# Patient Record
Sex: Female | Born: 1955 | Race: White | Hispanic: No | Marital: Married | Smoking: Never smoker
Health system: Southern US, Community
[De-identification: ages and names within clinical notes are randomized; demographics above are authoritative.]

## PROBLEM LIST (undated history)

## (undated) DIAGNOSIS — M778 Other enthesopathies, not elsewhere classified: Secondary | ICD-10-CM

## (undated) HISTORY — PX: ELBOW SURGERY: SHX618

## (undated) HISTORY — DX: Other enthesopathies, not elsewhere classified: M77.8

---

## 2005-02-22 ENCOUNTER — Encounter: Admission: RE | Admit: 2005-02-22 | Discharge: 2005-02-22 | Payer: Self-pay | Admitting: Gynecology

## 2005-04-05 ENCOUNTER — Encounter: Admission: RE | Admit: 2005-04-05 | Discharge: 2005-04-05 | Payer: Self-pay | Admitting: Gynecology

## 2006-05-21 ENCOUNTER — Encounter: Admission: RE | Admit: 2006-05-21 | Discharge: 2006-05-21 | Payer: Self-pay | Admitting: Obstetrics & Gynecology

## 2006-05-23 ENCOUNTER — Encounter: Payer: Self-pay | Admitting: Family Medicine

## 2006-05-23 ENCOUNTER — Ambulatory Visit: Payer: Self-pay | Admitting: Obstetrics & Gynecology

## 2006-08-13 ENCOUNTER — Encounter: Admission: RE | Admit: 2006-08-13 | Discharge: 2006-08-13 | Payer: Self-pay | Admitting: Family Medicine

## 2007-06-23 ENCOUNTER — Ambulatory Visit (HOSPITAL_COMMUNITY): Admission: RE | Admit: 2007-06-23 | Discharge: 2007-06-23 | Payer: Self-pay | Admitting: Family Medicine

## 2007-06-26 ENCOUNTER — Encounter: Admission: RE | Admit: 2007-06-26 | Discharge: 2007-06-26 | Payer: Self-pay | Admitting: Family Medicine

## 2008-07-06 ENCOUNTER — Encounter: Payer: Self-pay | Admitting: Obstetrics & Gynecology

## 2008-07-06 ENCOUNTER — Ambulatory Visit: Payer: Self-pay | Admitting: Obstetrics & Gynecology

## 2008-07-08 ENCOUNTER — Encounter: Admission: RE | Admit: 2008-07-08 | Discharge: 2008-07-08 | Payer: Self-pay | Admitting: Family Medicine

## 2010-03-08 ENCOUNTER — Ambulatory Visit: Payer: Self-pay | Admitting: Family Medicine

## 2010-03-08 LAB — CONVERTED CEMR LAB
ALT: 19 units/L (ref 0–35)
AST: 22 units/L (ref 0–37)
Albumin: 4.9 g/dL (ref 3.5–5.2)
Alkaline Phosphatase: 66 units/L (ref 39–117)
Calcium: 9.5 mg/dL (ref 8.4–10.5)
Chloride: 103 meq/L (ref 96–112)
Cholesterol: 186 mg/dL (ref 0–200)
Hemoglobin: 13.9 g/dL (ref 12.0–15.0)
LDL Cholesterol: 82 mg/dL (ref 0–99)
MCHC: 32.9 g/dL (ref 30.0–36.0)
MCV: 100 fL (ref 78.0–100.0)
Potassium: 3.7 meq/L (ref 3.5–5.3)
RBC: 4.22 M/uL (ref 3.87–5.11)
Total Bilirubin: 0.8 mg/dL (ref 0.3–1.2)
Total Protein: 6.5 g/dL (ref 6.0–8.3)
Triglycerides: 52 mg/dL (ref <150)
WBC: 3.9 10*3/uL — ABNORMAL LOW (ref 4.0–10.5)

## 2010-12-17 ENCOUNTER — Encounter: Payer: Self-pay | Admitting: Family Medicine

## 2011-04-10 NOTE — Assessment & Plan Note (Signed)
[name], Tina Christensen NO.:  [id number]   MEDICAL RECORD NO.:  [id number]          PATIENT TYPE:  POB   LOCATION:  [facility] at [facility]         FACILITY:  [facility]   PHYSICIAN:  Tinnie Gens, MD        DATE OF BIRTH:  [birth year]   DATE OF SERVICE:  03/08/2010                                  CLINIC NOTE   CHIEF COMPLAINT:  Yearly exam.   HISTORY OF PRESENT ILLNESS:  The patient is a 55 year old, gravida 1,  para 1, who comes in today for yearly exam.  She is without complaints.  She has been menopausal since 2003.  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  The patient has just chest pain last year  placed nursing notes head to [place], that seems to have resolved  itself.  She continues on Zoloft daily for depression and notes that  when she stops this medicine she becomes dizzy.  Otherwise, is without  significant complaint today.   PAST MEDICAL HISTORY:  Significant for depression.   PAST SURGICAL HISTORY:  Elbow surgery.   MEDICATIONS:  Zoloft 50 mg p.o. daily.   ALLERGIES:  None known.   OBSTETRICAL HISTORY:  G1, P1, vaginal delivery.   GYNECOLOGIC HISTORY:  No history of abnormal Pap for STD.  She has been  married to the same man for many years.   FAMILY HISTORY:  Significant for Parkinson's disease.  No breast, GYN,  or colon cancer.  She does have history of hypertension in her elderly  parents.   SOCIAL HISTORY:  She is a social alcohol user about half a glass of wine  per day.  No tobacco, alcohol, or drugs.  She is working as a  Catering manager.   REVIEW OF SYSTEMS:  She denies headache, vision changes, shortness of  breath.  The patient reports some chest pain thought to be related,  returns almost just sitting there.  She had a 2-hour tennis match that  was fairly vigorous without any chest pain last night.  She states that  the pressure sensation does not  radiate to back or jaw or arm.  She  notes no diaphoresis or nausea associated with this chest pain.  She  would like to get her cholesterol checked.  She denies significant  heartburn, breast masses, breast tenderness, nipple discharge,  heartburn, diarrhea, constipation, abdominal pain, blood in her stool,  blood in her urine, trouble passing urine, feet swelling in her feet or  ankles.   PHYSICAL EXAMINATION:  VITAL SIGNS:  Today, her height is 5 feet 9  inches.  She is 120 pounds, blood pressure is 118/69.  GENERAL:  She is a well-developed, well-nourished female, in no acute  distress.  HEENT:  Normocephalic, atraumatic.  Sclerae are anicteric.  NECK:  Supple.  Normal thyroid.  LUNGS:  Clear bilaterally.  CV:  Regular rate and rhythm.  No rubs, gallops, or murmurs.  ABDOMEN:  Soft, nontender, nondistended.  BREASTS:  Symmetric with everted nipples.  No masses.  No  supraclavicular or axillary adenopathy.  GU:  Normal external female genitalia.  BUS is normal.  Vagina is pale  with loss of rugae.  Uterus is small, anteverted.  No adnexal mass or  tenderness with good exam possible.  EXTREMITIES:  No cyanosis, clubbing, or edema.   IMPRESSION:  1. Annual exam.  2. Age greater than 50.  3. Some intermittent chest pain, unclear etiology.   PLAN:  1. Pap smear today.  2. Schedule mammogram.  3. GI referral again for colonoscopy.  She is reminded that this is      necessary after age 11, although she has been reminded this for      several years and has not yet gone to get it done.  4. We will advise her to see the primary care physician if her chest      pain continues for full workup.  5. We will check cholesterol, TSH, CMP, and CBC today.  6. I have refilled her Zoloft for a year.           ______________________________  Tinnie Gens, MD     TP/MEDQ  D:  03/08/2010  T:  03/09/2010  Job:  [id number]

## 2011-04-10 NOTE — Assessment & Plan Note (Signed)
NAMEEILY, Tina NO.:  [id number]   MEDICAL RECORD NO.:  [id number]          PATIENT TYPE:  POB   LOCATION:  [facility] at [facility]         FACILITY:  [facility]   PHYSICIAN:  Allie Bossier, MD        DATE OF BIRTH:  1956/08/30   DATE OF SERVICE:  07/06/2008                                  CLINIC NOTE   IDENTIFICATION:  [name] is a 55 year old married white gravida 1, para  42 with a 51 year old son.  She is here for her annual exam.  She has no  particular complaints.   MEDICAL PROBLEMS:  Depression.   MEDICINES:  Zoloft 50 mg daily.   REVIEW OF SYSTEMS:  She has been married for the last 22 years.  She is  unemployed at present.  She and her husband both have equally low  libidos and are content with this situation.  She has not yet had her  colonoscopy and she does have a mammogram scheduled for this week.   PAST SURGICAL HISTORY:  Right elbow surgery (bones for removal).   FAMILY HISTORY:  Negative for breast, GYN, and colon malignancies.   ALLERGIES:  No known drug allergies.  No latex allergy.   SOCIAL HISTORY:  Positive for social alcohol.   PHYSICAL EXAMINATION:  VITAL SIGNS:  Height 5 feet 9 inches, weight 118,  blood pressure 118/76, and pulse 58.  HEENT:  Normal.  HEART:  Regular rate and rhythm.  LUNGS:  Clear to auscultation bilaterally.  BREASTS:  Normal bilaterally.  ABDOMEN:  Benign.  GU:  External genitalia, marked atrophy.  Cervix atrophic, no lesions.  Uterus normal size and shape, anteverted, mobile.  Adnexa nontender.  No  masses.   ASSESSMENT/PLAN:  Annual exam.  I have checked a Pap smear.  Recommended  self-breast and self vulvar exams monthly.  I have again encouraged her  to have a screening colonoscopy.  She will have a mammogram done this  week.  She will continue on her Zoloft as necessary.      Allie Bossier, MD     MCD/MEDQ  D:  07/06/2008  T:  07/07/2008  Job:  [id number]

## 2011-04-13 NOTE — Group Therapy Note (Signed)
Tina Christensen, [name] NO.:  [id number]   MEDICAL RECORD NO.:  [id number]          PATIENT TYPE:  POB   [facility]                   FACILITY:  [facility]   PHYSICIAN:  Tinnie Gens, MD        DATE OF BIRTH:  [birth year]   DATE OF SERVICE:  05/23/2006                                    CLINIC NOTE   CHIEF COMPLAINT:  Yearly examination.   HISTORY OF PRESENT ILLNESS:  The patient is a 55 year old gravida 1, para 1  who is perimenopausal since July of 2003.  She was previously on estrogen  and Premarin cream; however, has not been taking that recently.  The patient  denies hot flashes.  Patient's last mammogram was approximately two days  ago.  Patient has not had colon cancer screening as yet.  The patient does  report some vaginal discharge several days ago.  She denies odor or vaginal  irritation with it and it seems to be resolved for now.  Patient is  currently on 50 mg of Zoloft which seems to control her symptoms.  She only  takes it as needed.  The patient is not on any calcium or vitamin D  supplementation.  Her last Pap smear was in March of 2006 and was  unsatisfactory for evaluation.   PAST MEDICAL HISTORY:  Significant for depression.   PAST SURGICAL HISTORY:  Negative.   MEDICATIONS:  Zoloft 50 mg p.o. daily.   ALLERGIES:  None known.   FAMILY HISTORY:  Parkinson's.  There is no family history of diabetes,  hypertension, or cancers.   SOCIAL HISTORY:  No tobacco use.  She is a social alcohol user.  No drug  use.  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   14-point review of systems reviewed.  The patient denies vision changes,  headache, shortness of breath, chest pain, nausea, vomiting, diarrhea.  She  does have some constipation which is chronic for her.  She denies blood in  her stool.  She denies dysuria; vaginal discharge as in the HPI.  She denies  breast mass or tenderness or vaginal  bleeding.   PHYSICAL EXAMINATION:  VITAL SIGNS:  Blood pressure 119/70, weight 121,  pulse 59.  GENERAL:  She is a thin white female in no acute distress.  NECK:  Supple with normal thyroid.  LUNGS:  Clear bilaterally.  CARDIOVASCULAR:  Regular rate and rhythm.  No murmurs, rubs, or gallops.  BREASTS:  Symmetric with everted nipples.  No supraclavicular or axillary  adenopathy.  No breast masses.  ABDOMEN:  Soft, nontender, nondistended.  No organomegaly.  EXTREMITIES:  2+ distal pulses.  No clubbing, cyanosis, edema.  GENITOURINARY:  Normal external female genitalia.  BUS is normal.  The  vagina is atrophic and pale with loss of rugation.  The cervix is menopausal  and without lesion.  The uterus was small, anteverted, and without  tenderness.  The adnexa are without mass or tenderness.   IMPRESSION:  1.  Yearly examination.  2.  Menopause.   PLAN:  1.  Pap smear today.  2.  General health panel to include TSH, BNP, CBC, and lipid panel.  3.  Advised her to increase calcium and vitamin D supplementation to prevent      bone loss.  4.  GI referral for colon cancer screening as she is over 50.  Patient will      return in one year for yearly.  5.  Have refilled her Zoloft 50 mg p.o. daily.           ______________________________  Tinnie Gens, MD     TP/MEDQ  D:  05/23/2006  T:  05/23/2006  Job:  [id number]

## 2011-04-24 ENCOUNTER — Ambulatory Visit (INDEPENDENT_AMBULATORY_CARE_PROVIDER_SITE_OTHER): Payer: Commercial Indemnity | Admitting: Obstetrics & Gynecology

## 2011-04-24 DIAGNOSIS — Z1272 Encounter for screening for malignant neoplasm of vagina: Secondary | ICD-10-CM

## 2011-04-24 DIAGNOSIS — Z01419 Encounter for gynecological examination (general) (routine) without abnormal findings: Secondary | ICD-10-CM

## 2011-04-25 NOTE — Assessment & Plan Note (Signed)
Tina Christensen, ALAMILLO NO.:  [id number]  MEDICAL RECORD NO.:  [id number]           PATIENT TYPE:  LOCATION:  [facility]           FACILITY:  PHYSICIAN:  Allie Bossier, MD             DATE OF BIRTH:  DATE OF SERVICE:  04/24/2011                                 CLINIC NOTE  HISTORY OF PRESENT ILLNESS:  Tina Christensen is a 55 year old married white G1 P1, she has a 15 year old son.  She comes in here for an annual exam. She has no GYN complaints.  PAST MEDICAL HISTORY:  Significant for depression.  REVIEW OF SYSTEMS:  She has been married for the last 30 years.  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  She is __________.  She has a long history of decreased libido.  She says she and her husband essentially do not have sex because he has low libido as well.  Her labs last year showed her total cholesterol to be 186 with very high level of HDL and low levels LDL.  Her thyroid was normal last year as were the remainder of her labs.  PAST SURGICAL HISTORY:  Right elbow surgery.  MEDICATIONS:  Zoloft 50 mg daily.  FAMILY HISTORY:  Negative for breast and GYN cancer.  Her father at 91 years of age was recently diagnosed with colon cancer and her mother at 57 years of age suffers from Parkinson disease.  ALLERGIES:  No known drug allergies.  No latex allergies.  SOCIAL HISTORY:  She drinks socially but denies tobacco or illegal drug use.  PHYSICAL EXAMINATION:  GENERAL:  Well-nourished, well-hydrated pleasant white female.  Weight 120, height 5 feet 9 inches, blood pressure 103/63, pulse 69. HEENT:  Normal breast exam, normal bilaterally. HEART:  Regular rate and rhythm. LUNGS:  Clear to auscultation bilaterally. ABDOMEN:  Benign scaphoid.  No palpable hepatosplenomegaly. EXTERNAL GENITALIA:  Severe atrophy.  No lesions.  Cervix appears probably somewhat stenotic but normal.  Bimanual exam reveals a normal size and  shape anteverted mobile uterus and nonenlarged nontender adnexa.  ASSESSMENT/PLAN:  Annual exam.  I have checked Pap smear.  Recommended self-breast and self-vulvar exams.  We discussed getting more labs today, but she is not fasting and would prefer to wait until next year. I certainly agree with this plan.     Allie Bossier, MD    MCD/MEDQ  D:  04/24/2011  T:  04/24/2011  Job:  [id number]

## 2011-10-16 ENCOUNTER — Ambulatory Visit: Payer: Commercial Indemnity | Admitting: Family Medicine

## 2011-10-16 ENCOUNTER — Ambulatory Visit (INDEPENDENT_AMBULATORY_CARE_PROVIDER_SITE_OTHER): Payer: Commercial Indemnity | Admitting: Family Medicine

## 2011-10-16 ENCOUNTER — Encounter: Payer: Self-pay | Admitting: Family Medicine

## 2011-10-16 VITALS — BP 110/80 | HR 80 | Temp 97.6°F | Ht 69.0 in | Wt 121.0 lb

## 2011-10-16 DIAGNOSIS — M7550 Bursitis of unspecified shoulder: Secondary | ICD-10-CM

## 2011-10-16 DIAGNOSIS — M67919 Unspecified disorder of synovium and tendon, unspecified shoulder: Secondary | ICD-10-CM

## 2011-10-16 DIAGNOSIS — M7580 Other shoulder lesions, unspecified shoulder: Secondary | ICD-10-CM

## 2011-10-16 DIAGNOSIS — M75100 Unspecified rotator cuff tear or rupture of unspecified shoulder, not specified as traumatic: Secondary | ICD-10-CM

## 2011-10-16 DIAGNOSIS — M719 Bursopathy, unspecified: Secondary | ICD-10-CM

## 2011-10-16 DIAGNOSIS — IMO0002 Reserved for concepts with insufficient information to code with codable children: Secondary | ICD-10-CM

## 2011-10-16 NOTE — Progress Notes (Signed)
Patient Name: Tina Christensen Date of Birth: [birth year] Age: 54 y.o. Medical Record Number: [id number] Gender: female  History of Present Illness:  Tina Christensen is a 55 y.o. very pleasant female patient who presents with the following:  Works out on the ellipitical, plays golf.   RHD A few months. No injury.  No neck pain No lateral shoulder pain.  IROM and abd pain  The patient noted above presents with shoulder pain that has been ongoing for a few months.  there is no history of trauma or accident. The patient denies neck pain or radicular symptoms. Denies dislocation, subluxation, separation of the shoulder. The patient does complain of pain in the overhead plane.  Medications Tried: NSAIDS Tried PT: No  Prior shoulder Injury: No Prior surgery: No Prior fracture: No  Handedness: RHD   Past Medical History, Surgical History, Social History, Family History, and Problem List have been reviewed in EHR and updated if relevant.  Review of Systems:  GEN: No fevers, chills. Nontoxic. Primarily MSK c/o today. MSK: Detailed in the HPI GI: tolerating PO intake without difficulty Neuro: No numbness, parasthesias, or tingling associated. Otherwise the pertinent positives of the ROS are noted above.    Physical Examination: Filed Vitals:   10/16/11 1354  BP: 110/80  Pulse: 80  Temp: 97.6 F (36.4 C)  TempSrc: Oral  Height: 5\' 9"  (1.753 m)  Weight: 121 lb (54.885 kg)  SpO2: 100%    GEN: Well-developed,well-nourished,in no acute distress; alert,appropriate and cooperative throughout examination HEENT: Normocephalic and atraumatic without obvious abnormalities. Ears, externally no deformities PULM: Breathing comfortably in no respiratory distress EXT: No clubbing, cyanosis, or edema PSYCH: Normally interactive. Cooperative during the interview. Pleasant. Friendly and conversant. Not anxious or depressed appearing. Normal, full affect.  Shoulder: R Inspection: No  muscle wasting or winging Ecchymosis/edema: neg  AC joint, scapula, clavicle: NT Cervical spine: NT, full ROM Spurling's: neg Abduction: full, 5/5 - mild pain, painful arc, mild Flexion: full, 5/5 IR, full, lift-off: 5/5, mod pain ER at neutral: full, 5/5 AC crossover: neg Neer: neg Hawkins: mild pos Drop Test: neg Empty Can: mild pos Supraspinatus insertion: NT Bicipital groove: NT Speed's: neg Yergason's: neg Sulcus sign: neg Scapular dyskinesis: none C5-T1 intact  Neuro: Sensation intact Grip 5/5  Diagnostic Ultrasound Evaluation Terason t3000, MSK ultrasound, MSK probe Anatomy scanned: R shoulder Indication: Pain Findings: Bicipital tendon appears intact without significant fluid surrounding it. Subscapularis is intact with no evidence of tearing. There is only minimal impingement at the coracoid process, but there is some evidence of subcoracoid bursitis present with dynamic testing. Supraspinatus is intact. There is 1 small region of hyperechoic area in the supraspinatus consistent with small prior healed supraspinatus tear. There is no significant subacromial bursitis. There is no significant subacromial impingement with dynamic testing. Infraspinatus appears intact. There is no significant acromioclavicular arthritis and no significant fluid in the acromioclavicular joint.   Assessment and Plan: 1. Rotator cuff tendonitis  Ambulatory referral to Physical Therapy  2. Subcoracoid bursitis      Rotator Cuff Tendinopathy  Shoulder anatomy was reviewed with the patient using and anatomical model.  Rotator cuff strengthening and scapular stabilization exercises were reviewed with the patient.  [facility] RTC and scapular stabilization program given to the patient.  Retraining shoulder mechanics and function was emphasized to the patient with rehab done at least 5-6 days a week. The patient could benefit from formal PT to assist with scapular stabilization and RTC  strengthening.  Alleve 2  tabs by mouth two times a day over the counter: Take at least for 2 - 3 weeks. This is equal to a prescripton strength dose (GENERIC CHEAPER EQUIVALENT IS NAPROXEN SODIUM)

## 2011-10-16 NOTE — Patient Instructions (Signed)
REFERRAL: GO THE THE FRONT ROOM AT THE ENTRANCE OF OUR CLINIC, NEAR CHECK IN. ASK FOR Tina Christensen. SHE WILL HELP YOU SET UP YOUR REFERRAL. DATE: TIME:   Recheck 6 weeks 

## 2011-11-15 ENCOUNTER — Other Ambulatory Visit: Payer: Self-pay | Admitting: *Deleted

## 2011-11-15 DIAGNOSIS — F329 Major depressive disorder, single episode, unspecified: Secondary | ICD-10-CM

## 2011-11-15 MED ORDER — SERTRALINE HCL 50 MG PO TABS: 50.0000 mg | ORAL_TABLET | Freq: Every day | ORAL | Status: DC

## 2011-11-15 NOTE — Telephone Encounter (Signed)
Patient needs refill of Zoloft

## 2012-01-04 ENCOUNTER — Telehealth: Payer: Self-pay | Admitting: *Deleted

## 2012-01-04 DIAGNOSIS — M75100 Unspecified rotator cuff tear or rupture of unspecified shoulder, not specified as traumatic: Secondary | ICD-10-CM

## 2012-01-04 DIAGNOSIS — M25519 Pain in unspecified shoulder: Secondary | ICD-10-CM

## 2012-01-04 DIAGNOSIS — M7550 Bursitis of unspecified shoulder: Secondary | ICD-10-CM

## 2012-01-04 NOTE — Telephone Encounter (Signed)
yes

## 2012-01-04 NOTE — Telephone Encounter (Signed)
Patient is asking for referral to [facility] and [facility] for her shoulder pain. Is this okay?

## 2012-01-07 ENCOUNTER — Ambulatory Visit (INDEPENDENT_AMBULATORY_CARE_PROVIDER_SITE_OTHER): Payer: Commercial Indemnity | Admitting: Family Medicine

## 2012-01-07 ENCOUNTER — Encounter: Payer: Self-pay | Admitting: Family Medicine

## 2012-01-07 ENCOUNTER — Ambulatory Visit (INDEPENDENT_AMBULATORY_CARE_PROVIDER_SITE_OTHER)
Admission: RE | Admit: 2012-01-07 | Discharge: 2012-01-07 | Disposition: A | Discharge status: Home / Self Care | Payer: BC Managed Care – PPO | Source: Ambulatory Visit | Attending: Family Medicine | Admitting: Family Medicine

## 2012-01-07 DIAGNOSIS — M7500 Adhesive capsulitis of unspecified shoulder: Secondary | ICD-10-CM

## 2012-01-07 DIAGNOSIS — L989 Disorder of the skin and subcutaneous tissue, unspecified: Secondary | ICD-10-CM

## 2012-01-07 DIAGNOSIS — M7501 Adhesive capsulitis of right shoulder: Secondary | ICD-10-CM

## 2012-01-07 DIAGNOSIS — M25511 Pain in right shoulder: Secondary | ICD-10-CM

## 2012-01-07 DIAGNOSIS — R21 Rash and other nonspecific skin eruption: Secondary | ICD-10-CM

## 2012-01-07 DIAGNOSIS — M25519 Pain in unspecified shoulder: Secondary | ICD-10-CM

## 2012-01-07 NOTE — Progress Notes (Signed)
Patient Name: Tina Christensen Date of Birth: November 24, 1956 Age: 56 y.o. Medical Record Number: [id number] Gender: female Date of Encounter: 01/07/2012  History of Present Illness:  Tina Christensen is a 56 y.o. very pleasant female patient who presents with the following:  Continued shoulder pain, right-sided. I initially saw her in November, 2012. At that point her exam was consistent with some mild tendinopathy. Motion was excellent. On ultrasound examination, there was no evidence for an acute rotator cuff tear. There was some very mild subcoracoid bursitis only.  Went to PT a couple of times, but she has not really gotten better. Tina Christensen tto some neuromuscular rehab. Then saw Dr. Alfredo Bach.  In the mean time, has ripped up carpetting. Uses her arms and shoulder all the time. Doing RTC and scapular stabilization.   Since that time, the patient has been having some restriction in her motion, and as well as a continued dull ache. No significant shoulder blade pain. Mild neck pain only.  Past Medical History, Surgical History, Social History, Family History, Problem List, Medications, and Allergies have been reviewed and updated if relevant.  Review of Systems:  GEN: No fevers, chills. Nontoxic. Primarily MSK c/o today. MSK: Detailed in the HPI GI: tolerating PO intake without difficulty Neuro: No numbness, parasthesias, or tingling associated. Otherwise the pertinent positives of the ROS are noted above.    Physical Examination: Filed Vitals:   01/07/12 0948  BP: 100/64  Pulse: 64  Temp: 97.6 F (36.4 C)  TempSrc: Oral  Height: 5\' 8"  (1.727 m)  Weight: 117 lb 1.9 oz (53.125 kg)  SpO2: 100%    Body mass index is 17.81 kg/(m^2).   GEN: WDWN, NAD, Non-toxic, Alert & Oriented x 3 HEENT: Atraumatic, Normocephalic.  Ears and Nose: No external deformity. EXTR: No clubbing/cyanosis/edema NEURO: Normal gait.  PSYCH: Normally interactive. Conversant. Not depressed or anxious appearing.  Calm  demeanor.   Shoulder: R Inspection: No muscle wasting or winging Ecchymosis/edema: neg  AC joint, scapula, clavicle: NT Cervical spine: NT, full ROM Spurling's: neg Abduction:  5/5, Loss of 25 deg compared to contralateral side Flexion: 5/5, loss of 15 degrees IR, full, lift-off: 5/5, in 90 deg abduction, loss of 35 deg compared to contralateral side. Mild pain with testing ER at neutral: full, 5/5 AC crossover and compression: mild pain Neer: equivocal Hawkins: neg Drop Test: neg Empty Can: neg Supraspinatus insertion: NT Bicipital groove: NT Speed's: neg Yergason's: neg Sulcus sign: neg Scapular dyskinesis: none C5-T1 intact Sensation intact Grip 5/5   Assessment and Plan:  1. Rash  Ambulatory referral to Dermatology  2. Skin lesion  Ambulatory referral to Dermatology  3. Shoulder pain, right  DG Shoulder Right, MR Shoulder Right Wo Contrast  4. Adhesive capsulitis of right shoulder      Rash, trial of cortaid, derm referral. Flat macular rash L cheek and elevated lesion right forehead. Suggested eval and ? Biopsy to patient  Shoulder has plateaued, certainly not significantly improved, and her range of motion has worsened. Likely onset of some secondary adhesive capsulitis. Obtain plain films. Obtain MRI of the RIGHT shoulder to evaluate for potential partial thickness versus full thickness rotator cuff tear not appreciated previously as well as complete evaluation of the bicipital tendon as well as the acromioclavicular joint in a patient who has done poorly over a 6 month time period.  I suggested an intra-articular steroid injection, which has been on for decades for adhesive capsulitis. The patient is reluctant and declined. Reviewed [facility]  ROM protocol  She also requested information regarding a potential 2nd opinion. I have asked my office staff to assist her in seeing Dr. Dion Saucier or Dr. Thurston Hole

## 2012-01-07 NOTE — Patient Instructions (Signed)
REFERRAL: GO THE THE FRONT ROOM AT THE ENTRANCE OF OUR CLINIC, NEAR CHECK IN. ASK FOR MARION. SHE WILL HELP YOU SET UP YOUR REFERRAL. DATE: TIME:  

## 2012-01-28 ENCOUNTER — Ambulatory Visit
Admission: RE | Admit: 2012-01-28 | Discharge: 2012-01-28 | Disposition: A | Discharge status: Home / Self Care | Payer: BC Managed Care – PPO | Source: Ambulatory Visit | Attending: Family Medicine | Admitting: Family Medicine

## 2012-01-28 DIAGNOSIS — M25511 Pain in right shoulder: Secondary | ICD-10-CM

## 2012-06-03 ENCOUNTER — Other Ambulatory Visit: Payer: Self-pay | Admitting: Family Medicine

## 2012-06-03 ENCOUNTER — Telehealth: Payer: Self-pay | Admitting: Family Medicine

## 2012-06-03 DIAGNOSIS — L989 Disorder of the skin and subcutaneous tissue, unspecified: Secondary | ICD-10-CM

## 2012-06-03 NOTE — Telephone Encounter (Signed)
Pt had an appointment set up at a Dermatology office in the past but had to cancel. She is wanting to know if another referral could be put in to see a Dermatologist.  Cell: [phone] or Work: [phone]

## 2012-06-03 NOTE — Progress Notes (Signed)
Referral made 

## 2012-10-17 ENCOUNTER — Other Ambulatory Visit: Payer: Self-pay | Admitting: Family Medicine

## 2012-10-17 ENCOUNTER — Telehealth: Payer: Self-pay | Admitting: Family Medicine

## 2012-10-17 DIAGNOSIS — H9190 Unspecified hearing loss, unspecified ear: Secondary | ICD-10-CM

## 2012-10-17 NOTE — Telephone Encounter (Signed)
The patient called and is hoping to get a referral for a hearing test.  Her call back is [phone]

## 2012-11-29 ENCOUNTER — Other Ambulatory Visit: Payer: Self-pay | Admitting: Family Medicine

## 2013-04-02 ENCOUNTER — Telehealth: Payer: Self-pay | Admitting: *Deleted

## 2013-04-02 MED ORDER — SERTRALINE HCL 50 MG PO TABS: ORAL_TABLET | ORAL | Status: AC

## 2013-04-02 NOTE — Telephone Encounter (Signed)
Pharmacy is requesting a 90 day supply of her sertraline.

## 2013-07-30 IMAGING — CR DG SHOULDER 2+V*R*
3 series · 3 of 3 positions shown · non-contrast
Comparison: None.

CLINICAL DATA: Pain without trauma

RIGHT SHOULDER - 2+ VIEW

[view not recorded (1 of 3)]
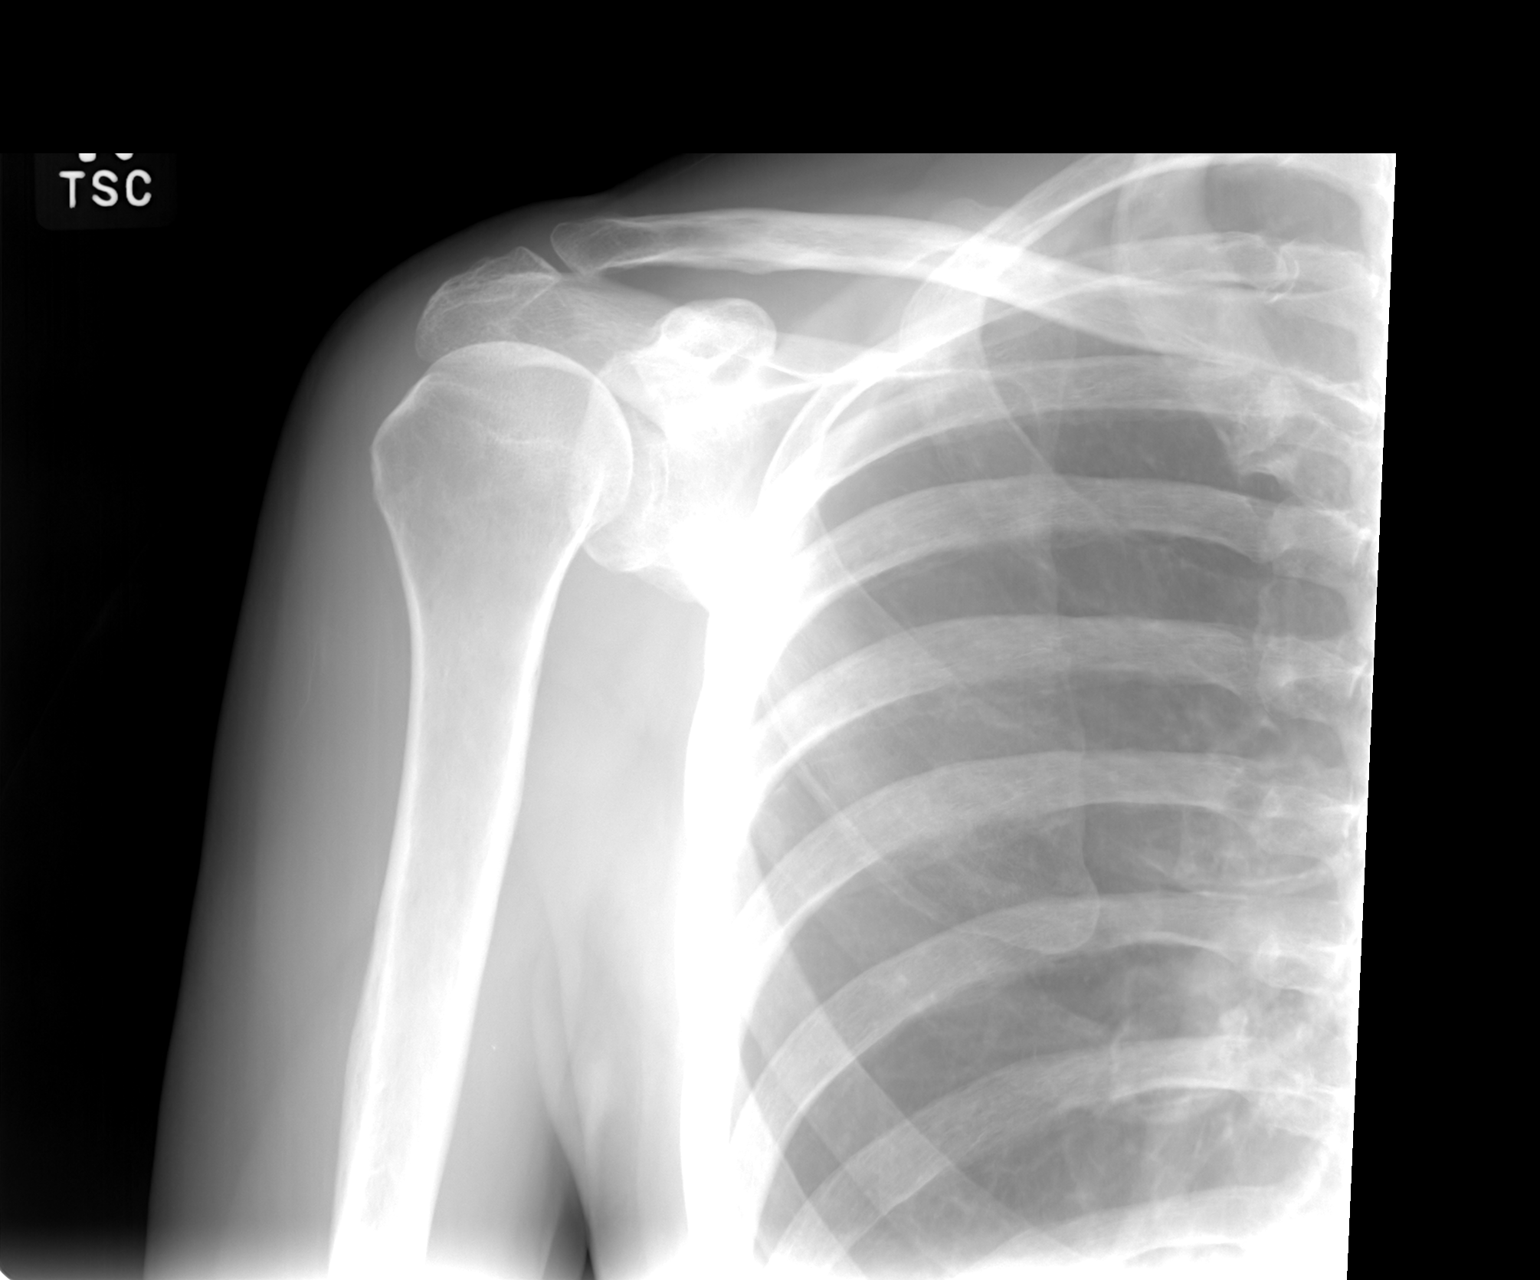

[view not recorded (2 of 3)]
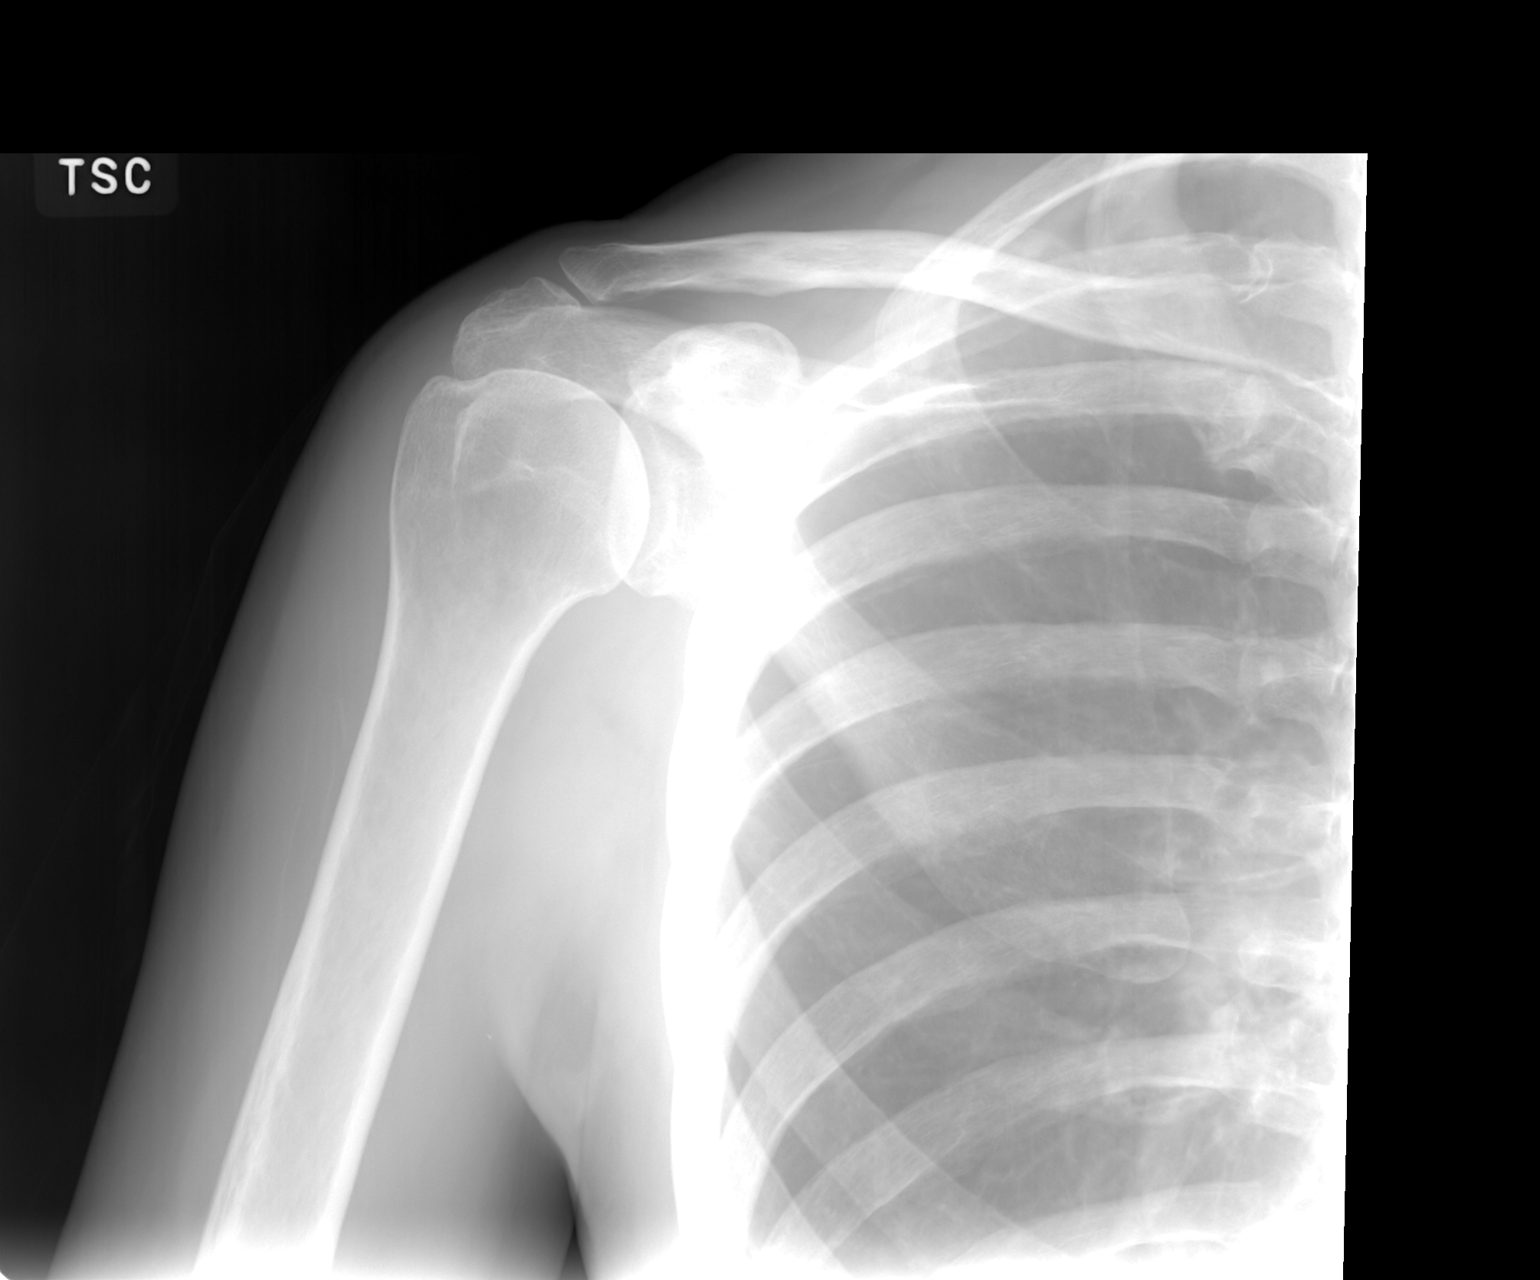

[view not recorded (3 of 3)]
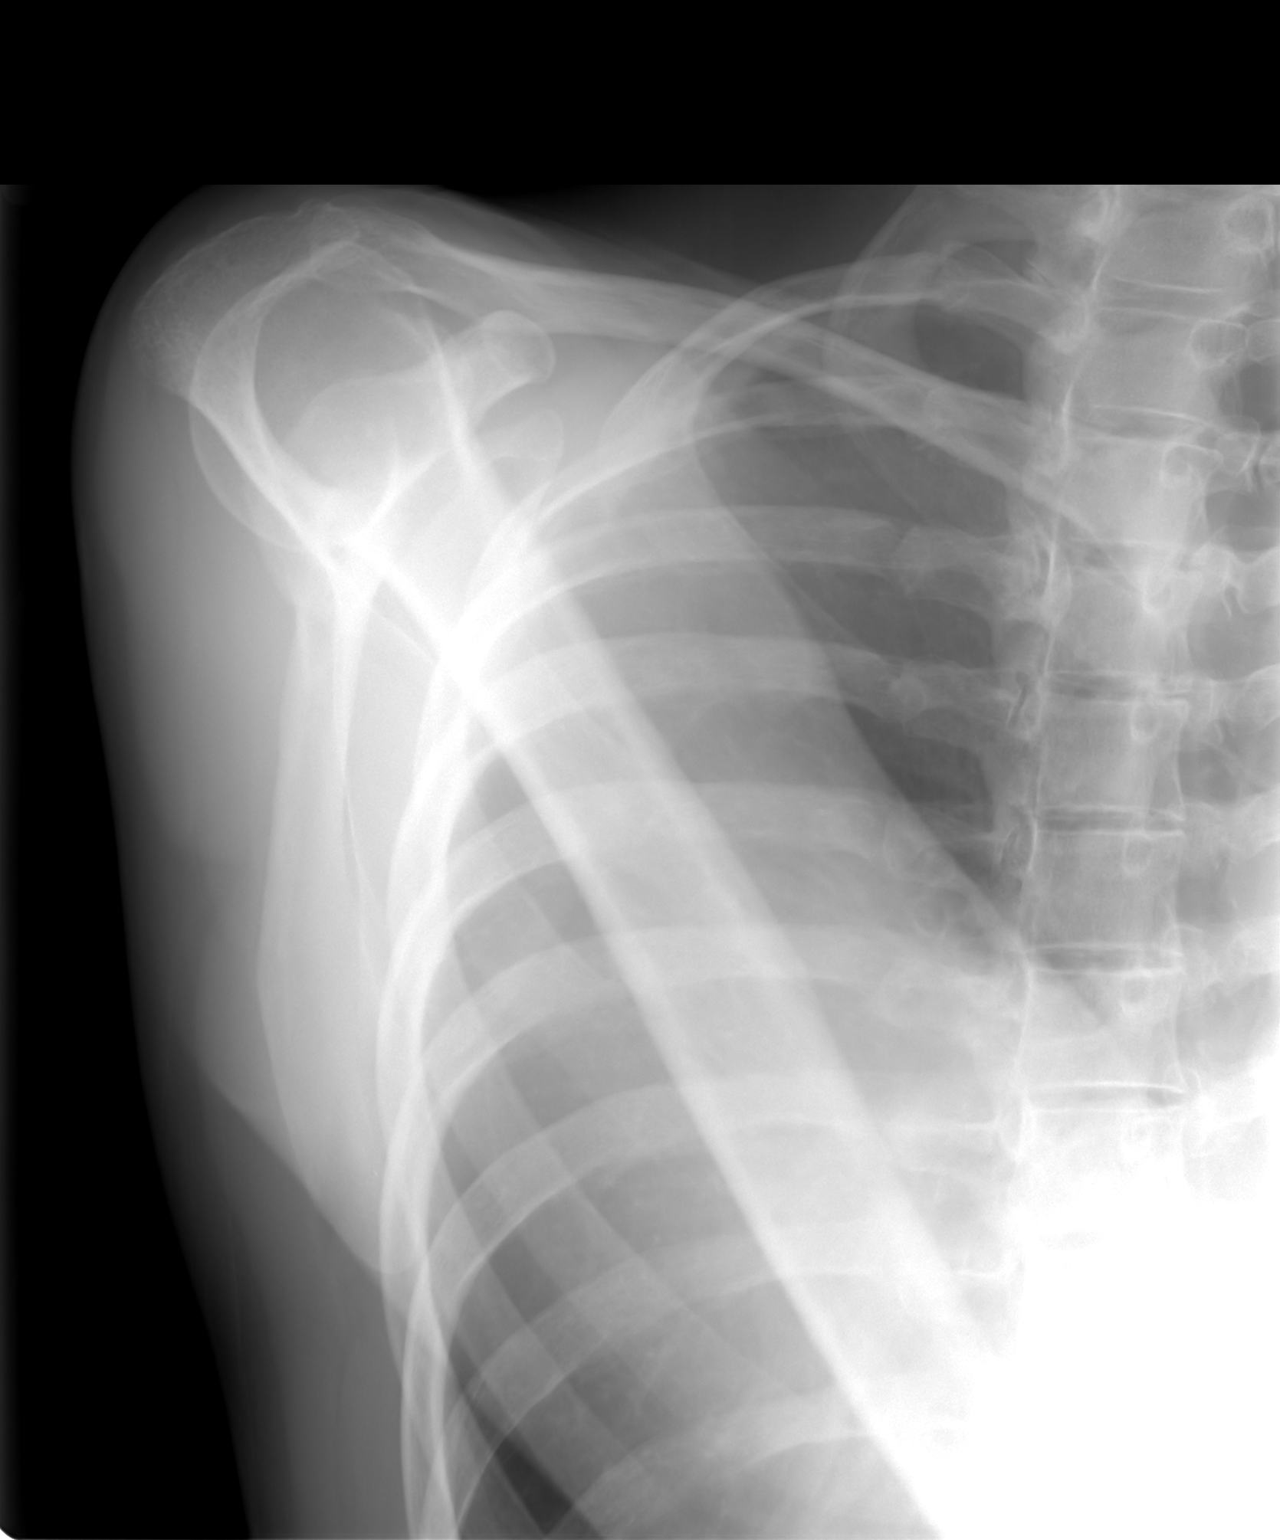

[3 of 3 positions shown; findings below may reference images not displayed]

FINDINGS: Negative for fracture, dislocation, or other acute
abnormality.  Normal alignment and mineralization. No significant
degenerative change.  Regional soft tissues unremarkable.
IMPRESSION: Negative

## 2014-08-20 ENCOUNTER — Telehealth: Payer: Self-pay | Admitting: Family Medicine

## 2014-08-20 DIAGNOSIS — R21 Rash and other nonspecific skin eruption: Secondary | ICD-10-CM

## 2014-08-20 NOTE — Telephone Encounter (Signed)
i have not seen the patient in 2 1/2 years.

## 2014-08-20 NOTE — Telephone Encounter (Signed)
Pt requesting referral to see dermotologist for bumps on skin. She has been seen before for this same issue but it has been awhile and needs to be seen again. Please advise

## 2014-11-16 ENCOUNTER — Ambulatory Visit (INDEPENDENT_AMBULATORY_CARE_PROVIDER_SITE_OTHER): Payer: Managed Care, Other (non HMO) | Admitting: Family Medicine

## 2014-11-16 ENCOUNTER — Encounter: Payer: Self-pay | Admitting: Family Medicine

## 2014-11-16 ENCOUNTER — Telehealth: Payer: Self-pay

## 2014-11-16 VITALS — BP 110/60 | HR 64 | Temp 98.7°F | Ht 68.0 in | Wt 117.5 lb

## 2014-11-16 DIAGNOSIS — L247 Irritant contact dermatitis due to plants, except food: Secondary | ICD-10-CM

## 2014-11-16 MED ORDER — TRIAMCINOLONE ACETONIDE 40 MG/ML IJ SUSP
40.0000 mg | Freq: Once | INTRAMUSCULAR | Status: AC
  Administered 2014-11-16: 40 mg via INTRAMUSCULAR

## 2014-11-16 NOTE — Progress Notes (Signed)
   Subjective:    Patient ID: Tina NorthKathleen O Semple, female    DOB: 1956-01-20, [age] y.o.   MRN: [id number]  Poison [place] This is a new problem. The current episode started in the past 7 days. The problem has been gradually worsening since onset. The affected locations include the left wrist, face, left buttock, back, neck, left arm, right arm and chest. The rash is characterized by blistering, itchiness and redness. She was exposed to plant contact. Pertinent negatives include no congestion, cough, fever, joint pain, rhinorrhea or sore throat. (No airway compromise) Past treatments include antihistamine. The treatment provided mild relief. There is no history of allergies, asthma, eczema or varicella.      Review of Systems  Constitutional: Negative for fever.  HENT: Negative for congestion, rhinorrhea and sore throat.   Respiratory: Negative for cough.   Musculoskeletal: Negative for joint pain.       Objective:   Physical Exam  Constitutional: Vital signs are normal. She appears well-developed and well-nourished. She is cooperative.  Non-toxic appearance. She does not appear ill. No distress.  HENT:  Head: Normocephalic.  Right Ear: Hearing, tympanic membrane, external ear and ear canal normal. Tympanic membrane is not erythematous, not retracted and not bulging.  Left Ear: Hearing, tympanic membrane, external ear and ear canal normal. Tympanic membrane is not erythematous, not retracted and not bulging.  Nose: No mucosal edema or rhinorrhea. Right sinus exhibits no maxillary sinus tenderness and no frontal sinus tenderness. Left sinus exhibits no maxillary sinus tenderness and no frontal sinus tenderness.  Mouth/Throat: Uvula is midline, oropharynx is clear and moist and mucous membranes are normal.  Eyes: Conjunctivae, EOM and lids are normal. Pupils are equal, round, and reactive to light. Lids are everted and swept, no foreign bodies found.  Neck: Trachea normal and normal range of motion.  Neck supple. Carotid bruit is not present. No thyroid mass and no thyromegaly present.  Cardiovascular: Normal rate, regular rhythm, S1 normal, S2 normal, normal heart sounds, intact distal pulses and normal pulses.  Exam reveals no gallop and no friction rub.   No murmur heard. Pulmonary/Chest: Effort normal and breath sounds normal. No tachypnea. No respiratory distress. She has no decreased breath sounds. She has no wheezes. She has no rhonchi. She has no rales.  Abdominal: Soft. Normal appearance and bowel sounds are normal. There is no tenderness.  Neurological: She is alert.  Skin: Skin is warm, dry and intact. Rash noted. Rash is vesicular.     Psychiatric: Her speech is normal and behavior is normal. Judgment and thought content normal. Her mood appears not anxious. Cognition and memory are normal. She does not exhibit a depressed mood.          Assessment & Plan:

## 2014-11-16 NOTE — Telephone Encounter (Signed)
PLEASE NOTE: All timestamps contained within this report are represented as [place] Standard Time. CONFIDENTIALTY NOTICE: This fax transmission is intended only for the addressee. It contains information that is legally privileged, confidential or otherwise protected from use or disclosure. If you are not the intended recipient, you are strictly prohibited from reviewing, disclosing, copying using or disseminating any of this information or taking any action in reliance on or regarding this information. If you have received this fax in error, please notify us immediately by telephone so that we can arrange for its return to us. Phone: [phone], Toll-Free: [phone], Fax: [phone] Page: 1 of 2 Call Id: [id number] [facility] - Client TELEPHONE ADVICE RECORD [facility] Patient Name: Tina KoyanagiKATHLEEN Christensen Gender: Female DOB: May 03, 1956 Age: [age] Y 10 M 21 D Return Phone Number: [phone] (Primary) Address: [place]Zip: [place] [id number] Client [facility] - Client Client Site [facility] Day Physician Cop[place], Spencer Contact Type Call Call Type Triage / Clinical Relationship To Patient Self Return Phone Number [phone] (Primary) Chief Complaint Poison [place] Initial Comment Caller states, has poison [place] all over her PreDisposition Call Doctor Nurse Assessment Nurse: Elijah Birkaldwell, RN, Stark BrayLynda Date/Time (Eastern Time): 11/16/2014 9:49:12 AM Confirm and document reason for call. If symptomatic, describe symptoms. ---Caller states she has poison ivy all over her - buttocks, back & arms. Has spread & worsened over a period of 2 weeks. Has the patient traveled out of the country within the last 30 days? ---Not Applicable Does the patient require triage? ---Yes Related visit to physician within the last 2 weeks? ---No Does the PT have any chronic conditions? (i.e.  diabetes, asthma, etc.) ---No Guidelines Guideline Title Affirmed Question Affirmed Notes Nurse Date/Time (Eastern Time) Poison Ivy - Oak - [place] [1] Severe poison ivy, oak, or sumac reaction in the past AND [2] face involved Elijah BirkCaldwell, RN, Stark BrayLynda 11/16/2014 9:50:36 AM Disp. Time Lamount Cohen(Eastern Time) Disposition Final User 11/16/2014 9:53:19 AM See Physician within 4 Hours (or PCP triage) Yes Elijah Birkaldwell, RN, Irene ShipperLynda Caller Understands: Yes Disagree/Comply: Comply Care Advice Given Per Guideline PLEASE NOTE: All timestamps contained within this report are represented as [place] Standard Time. CONFIDENTIALTY NOTICE: This fax transmission is intended only for the addressee. It contains information that is legally privileged, confidential or otherwise protected from use or disclosure. If you are not the intended recipient, you are strictly prohibited from reviewing, disclosing, copying using or disseminating any of this information or taking any action in reliance on or regarding this information. If you have received this fax in error, please notify us immediately by telephone so that we can arrange for its return to us. Phone: [phone], Toll-Free: [phone], Fax: [phone] Page: 2 of 2 Call Id: [id number] Care Advice Given Per Guideline SEE PHYSICIAN WITHIN 4 HOURS (or PCP triage): * IF NO PCP TRIAGE: You need to be seen. Go to _______________ (ED/U[facility] or office if it will be open) within the next 3 or 4 hours. Go sooner if you become worse. CALL BACK IF: * You become worse. CARE ADVICE given per Poison Ivy - [place] - [place] (Adult) guideline. After Care Instructions Given Call Event Type User Date / Time Description Comments User: Lily LovingsLynda, Caldwell, RN Date/Time Lamount Cohen(Eastern Time): 11/16/2014 10:02:46 AM Warm transferred to scheduling at office for appt. today, it may be an hour after 4 hr. outcome. Referrals REFERRED TO PCP OFFICE

## 2014-11-16 NOTE — Telephone Encounter (Signed)
Pt has appt scheduled with Dr Ermalene SearingBedsole on 11/16/14 at 3:15 pm.

## 2014-11-16 NOTE — Progress Notes (Signed)
Pre visit review using our clinic review tool, if applicable. No additional management support is needed unless otherwise documented below in the visit note. 

## 2014-11-16 NOTE — Assessment & Plan Note (Signed)
Treat with steroid injection. Can use oral antihistamine.  if not improving as expected in 48 hours call for additional oral steroid course.

## 2014-11-16 NOTE — Addendum Note (Signed)
Addended by: Damita LackLORING, Voshon Petro S on: 11/16/2014 04:24 PM   Modules accepted: Orders

## 2014-11-16 NOTE — Patient Instructions (Signed)

## 2015-11-30 ENCOUNTER — Encounter: Payer: Self-pay | Admitting: Primary Care

## 2015-11-30 ENCOUNTER — Ambulatory Visit (INDEPENDENT_AMBULATORY_CARE_PROVIDER_SITE_OTHER): Payer: Managed Care, Other (non HMO) | Admitting: Primary Care

## 2015-11-30 VITALS — BP 114/68 | HR 61 | Temp 97.8°F | Ht 68.0 in | Wt 114.2 lb

## 2015-11-30 DIAGNOSIS — J329 Chronic sinusitis, unspecified: Secondary | ICD-10-CM

## 2015-11-30 MED ORDER — AMOXICILLIN-POT CLAVULANATE 875-125 MG PO TABS
1.0000 | ORAL_TABLET | Freq: Two times a day (BID) | ORAL | Status: AC
Start: 2015-11-30 — End: ?

## 2015-11-30 NOTE — Progress Notes (Signed)
   Subjective:    Patient ID: Tina Christensen, female    DOB: Mar 22, 1956, 60 y.o.   MRN: [id number]  HPI  Ms. Caryn SectionFox is a 60 year old female who presents today with a chief complaint of sinus pressure. She also reports sore throat, nasal congestion, cough, fatigue. She was evaluated at Urgent Care 2 weeks ago for the same symptoms. She was provided with a script for cefdinir, benzonotate casules, prednisone, and hycodan syrup. Her symptoms began again last Friday with fatigue, sinus pressure, nasal drainage. She's blowing green mucous from her nose and she's feeling worse. Denies fevers, ear pain, chills.   Review of Systems  Constitutional: Positive for fatigue. Negative for fever and chills.  HENT: Positive for congestion, sinus pressure and sore throat.   Respiratory: Positive for cough. Negative for shortness of breath.   Cardiovascular: Negative for chest pain.       Past Medical History  Diagnosis Date  . Elbow tendinitis     Social History   Social History  . Marital Status: Married    Spouse Name: N/A  . Number of Children: N/A  . Years of Education: N/A   Occupational History  . Operations and sales    Social History Main Topics  . Smoking status: Never Smoker   . Smokeless tobacco: Never Used  . Alcohol Use: 0.0 oz/week    0 Standard drinks or equivalent per week  . Drug Use: No  . Sexual Activity: Not on file   Other Topics Concern  . Not on file   Social History Narrative    Past Surgical History  Procedure Laterality Date  . Elbow surgery      No family history on file.  No Known Allergies  Current Outpatient Prescriptions on File Prior to Visit  Medication Sig Dispense Refill  . sertraline (ZOLOFT) 50 MG tablet TAKE 1 TABLET (50 MG TOTAL) BY MOUTH DAILY. (Patient not taking: Reported on 11/30/2015) 90 tablet 4   No current facility-administered medications on file prior to visit.    BP 114/68 mmHg  Pulse 61  Temp(Src) 97.8 F (36.6 C) (Oral)   Ht 5\' 8"  (1.727 m)  Wt 114 lb 3.2 oz (51.801 kg)  BMI 17.37 kg/m2  SpO2 98%    Objective:   Physical Exam  Constitutional: She appears well-nourished.  HENT:  Right Ear: Tympanic membrane and ear canal normal.  Left Ear: Tympanic membrane and ear canal normal.  Nose: Right sinus exhibits maxillary sinus tenderness and frontal sinus tenderness. Left sinus exhibits maxillary sinus tenderness and frontal sinus tenderness.  Mouth/Throat: Oropharynx is clear and moist.  Neck: Neck supple.  Cardiovascular: Normal rate and regular rhythm.   Pulmonary/Chest: Effort normal and breath sounds normal. She has no rales.  Lymphadenopathy:    She has no cervical adenopathy.  Skin: Skin is warm and dry.          Assessment & Plan:  Sinusitis:  Symptoms present prior to Christmas. Treated for sinusitis 1 week ago with Cefdinir, prednisone, tessalon pearls, hycodan. Started to feel slightly better for a few days, then worse on Friday last week. Exam with moderate tenderness to maxillary and frontal sinues. It's possible that the cefdinir wasn't strong enough to cover infection, also possible this could be viral. Given worsening symptoms on current regimen, will try Augmentin course. Continue prednisone and other supportive treatment. Return precautions provided.

## 2015-11-30 NOTE — Patient Instructions (Signed)
Start Augmentin antibiotics. Take 1 tablet by mouth twice daily for 7 days.  Start taking Zyrtec at bedtime for the next 2 weeks. Start Flonase (fluticasone) nasal spray. Instill 2 sprays in each nostril once daily.  Increase consumption of fluids and rest.  Please notify me if no improvement in symptoms in 3-4 days.  It was a pleasure meeting you!

## 2015-11-30 NOTE — Progress Notes (Signed)
Pre visit review using our clinic review tool, if applicable. No additional management support is needed unless otherwise documented below in the visit note.
